# Patient Record
Sex: Female | Born: 1995 | Race: Black or African American | Hispanic: No | Marital: Single | Smoking: Never smoker
Health system: Southern US, Community
[De-identification: ages and names within clinical notes are randomized; demographics above are authoritative.]

---
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
## 2017-12-19 ENCOUNTER — Other Ambulatory Visit: Payer: Self-pay | Admitting: Family Medicine

## 2017-12-19 DIAGNOSIS — N6320 Unspecified lump in the left breast, unspecified quadrant: Secondary | ICD-10-CM

## 2017-12-31 ENCOUNTER — Other Ambulatory Visit: Payer: Self-pay | Admitting: Family Medicine

## 2017-12-31 ENCOUNTER — Ambulatory Visit
Admission: RE | Admit: 2017-12-31 | Discharge: 2017-12-31 | Disposition: A | Discharge status: Home / Self Care | Payer: BLUE CROSS/BLUE SHIELD | Source: Ambulatory Visit | Attending: Family Medicine | Admitting: Family Medicine

## 2017-12-31 DIAGNOSIS — N6320 Unspecified lump in the left breast, unspecified quadrant: Secondary | ICD-10-CM

## 2018-01-03 ENCOUNTER — Ambulatory Visit
Admission: RE | Admit: 2018-01-03 | Discharge: 2018-01-03 | Disposition: A | Discharge status: Home / Self Care | Payer: BLUE CROSS/BLUE SHIELD | Source: Ambulatory Visit | Attending: Family Medicine | Admitting: Family Medicine

## 2018-01-03 DIAGNOSIS — N6320 Unspecified lump in the left breast, unspecified quadrant: Secondary | ICD-10-CM

## 2020-07-30 IMAGING — US US BREAST BX W LOC DEV 1ST LESION IMG BX SPEC US GUIDE*L*
1 series · 9 of 9 positions shown · non-contrast
Comparison: Previous exam(s).

ADDENDUM:
Pathology revealed FIBROADENOMA of LEFT breast, 2 o'clock. This was
found to be concordant by Dr. Jullon Lienad.

Pathology results were discussed with the patient by telephone. The
patient reported doing well after the biopsy with tenderness at the
site. Post biopsy instructions and care were reviewed and questions
were answered. The patient was encouraged to call The [REDACTED]
The patient was instructed to continue with monthly self breast
examinations, clinical follow-up as needed, and to return for annual
mammography at 41. The patient was informed a reminder notice would
be sent regarding this appointment.
Pathology results reported by Steeve Rastogi, RN on 01/06/2018.
CLINICAL DATA: 22-year-old female presenting for evaluation of a
palpable mass in the left breast at 2 o'clock.
EXAM:
ULTRASOUND GUIDED LEFT BREAST CORE NEEDLE BIOPSY

[Series 1: us breast bx w loc dev 1st lesion img bx spec us g · 0.07mm/px · 9 of 9 slices shown]
[im 1/9]
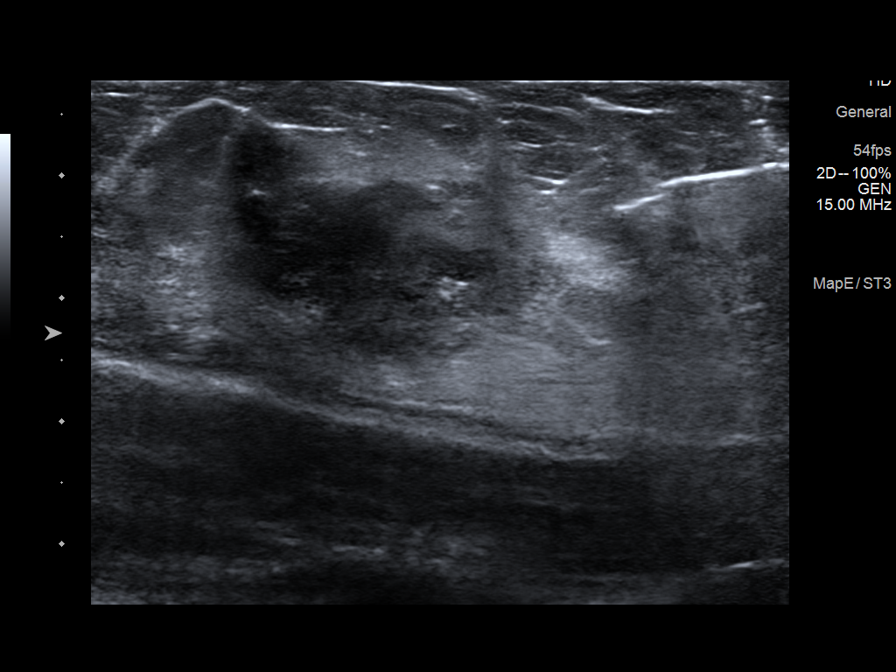
[im 2/9]
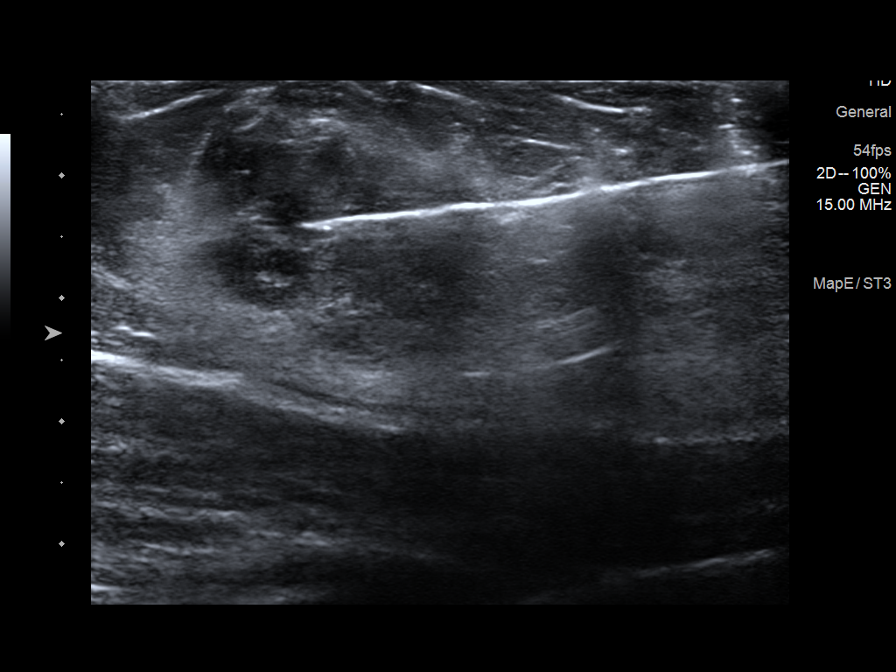
[im 3/9]
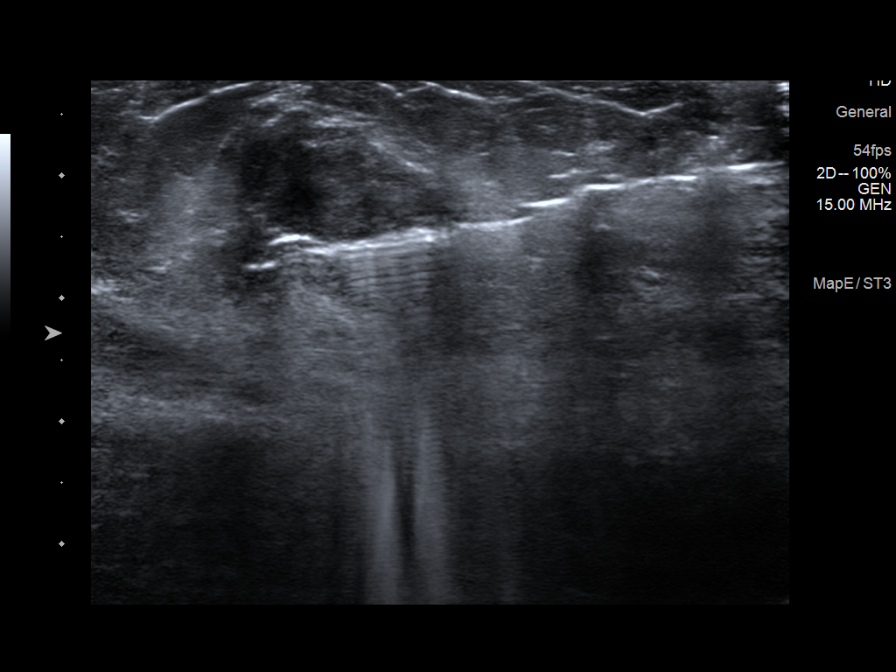
[im 4/9]
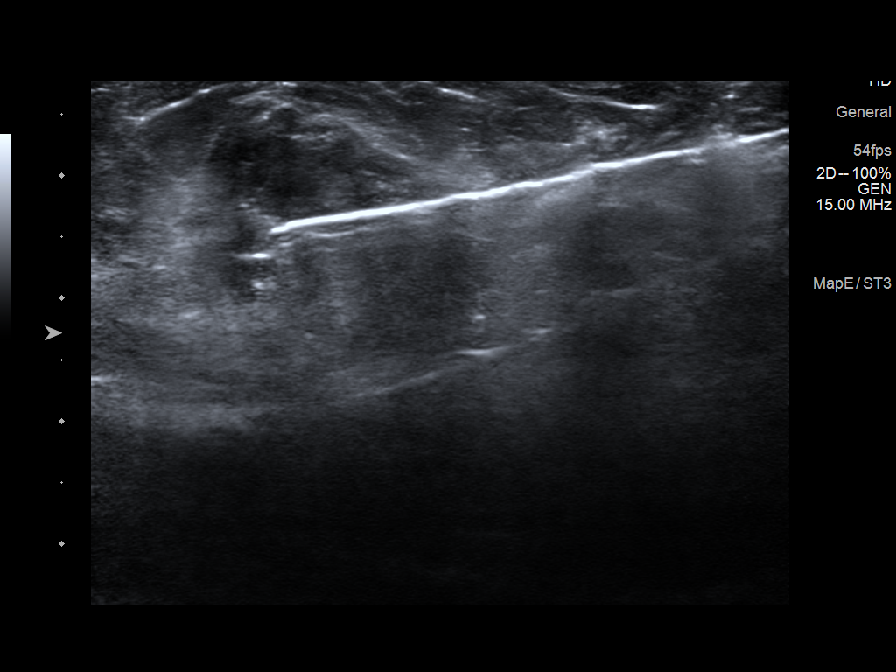
[im 5/9]
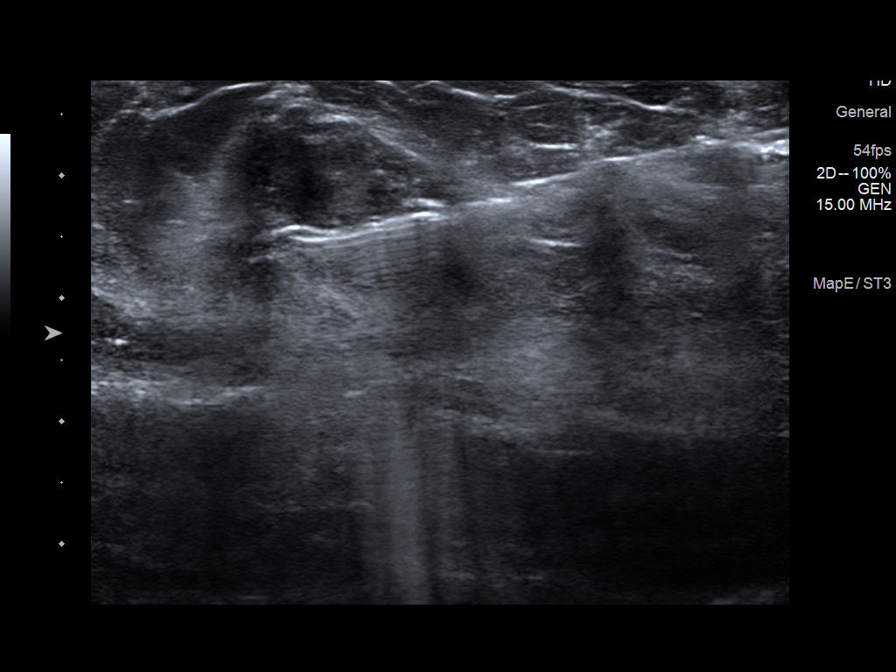
[im 6/9]
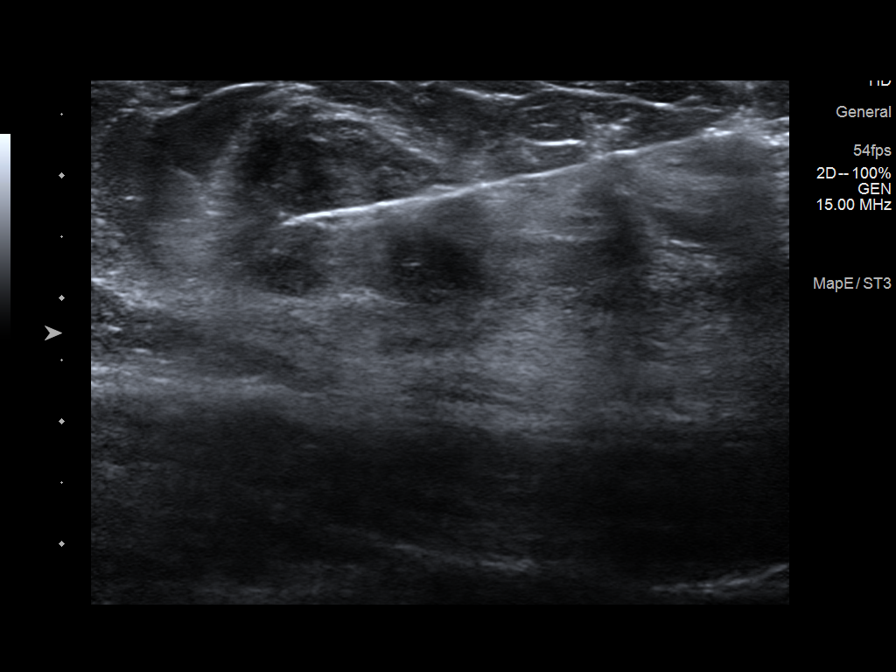
[im 7/9]
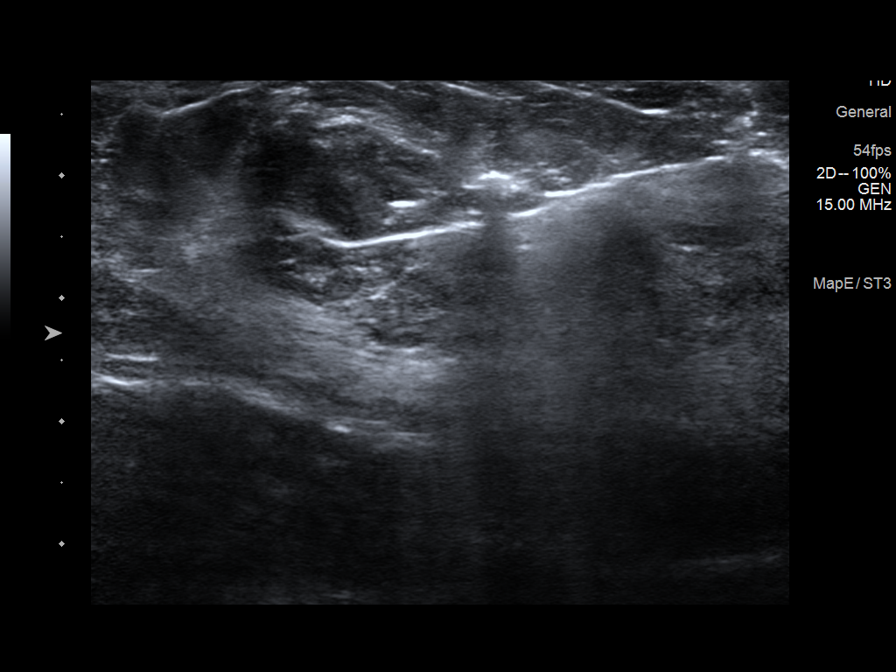
[im 8/9]
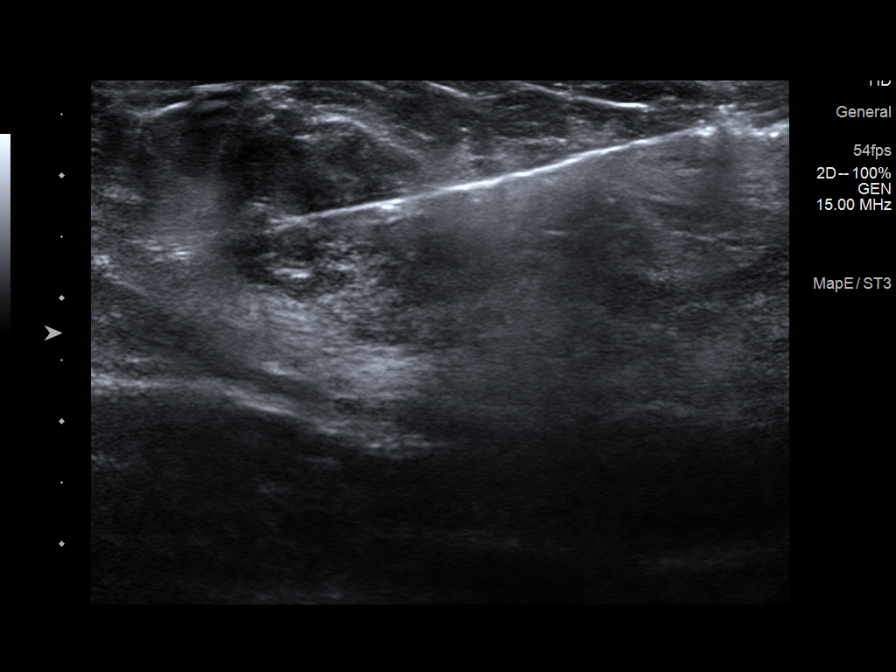
[im 9/9]
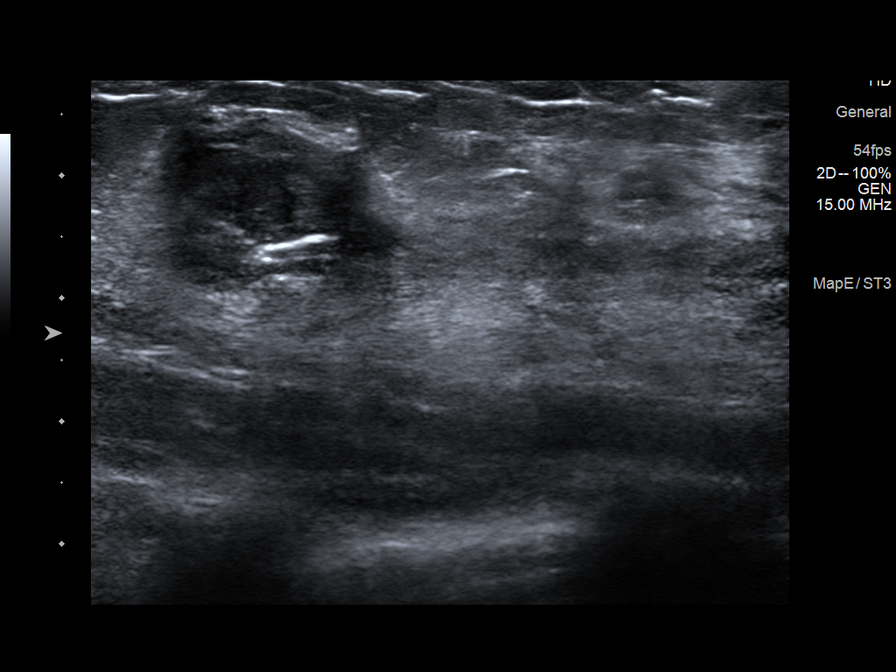

[9 of 9 positions shown; findings below may reference images not displayed]



Lesion quadrant: Upper-outer quadrant

Using sterile technique and 1% Lidocaine as local anesthetic, under
direct ultrasound visualization, a 14 gauge [place] device was
used to perform biopsy of a left breast mass at 2 o'clock using an
inferior approach. At the conclusion of the procedure a HydroMARK
spiral shaped tissue marker clip was deployed into the biopsy
cavity.
IMPRESSION: Ultrasound guided biopsy of a left breast mass at 2 o'clock. No
apparent complications.
